# Patient Record
Sex: Male | Born: 1935 | Race: White | Hispanic: No | Marital: Single | Smoking: Former smoker
Health system: Southern US, Community
[De-identification: ages and names within clinical notes are randomized; demographics above are authoritative.]

## PROBLEM LIST (undated history)

## (undated) DIAGNOSIS — F0390 Unspecified dementia without behavioral disturbance: Secondary | ICD-10-CM

## (undated) DIAGNOSIS — G20 Parkinson's disease: Secondary | ICD-10-CM

## (undated) HISTORY — PX: HERNIA REPAIR: SHX51

---

## 2015-02-21 ENCOUNTER — Encounter (HOSPITAL_COMMUNITY): Payer: Self-pay | Admitting: *Deleted

## 2015-02-21 ENCOUNTER — Emergency Department (HOSPITAL_COMMUNITY)
Admission: EM | Admit: 2015-02-21 | Discharge: 2015-02-21 | Disposition: A | Discharge status: Home / Self Care | Payer: Medicare Other | Attending: Emergency Medicine | Admitting: Emergency Medicine

## 2015-02-21 DIAGNOSIS — F0390 Unspecified dementia without behavioral disturbance: Secondary | ICD-10-CM | POA: Insufficient documentation

## 2015-02-21 DIAGNOSIS — Z590 Homelessness: Secondary | ICD-10-CM | POA: Diagnosis present

## 2015-02-21 HISTORY — DX: Unspecified dementia, unspecified severity, without behavioral disturbance, psychotic disturbance, mood disturbance, and anxiety: F03.90
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
## 2015-02-21 NOTE — Discharge Instructions (Signed)

## 2015-02-21 NOTE — ED Provider Notes (Signed)
CSN: [id number]     Arrival date & time 02/21/15  1520 History   First MD Initiated Contact with Patient 02/21/15 1527     Chief Complaint  Patient presents with  . Homeless     Level V caveat: Dementia  HPI Patient had been living with his elderly wife who also has dementia.  The patient was taken to his sister's house who he had not seen in 20 years 2 weeks ago.  His sister reports that she had been caring form however it is becoming more difficult given her own medical issues to deal with them and to handle him and therefore he was sent to the emergency department today for possible placement into a nursing facility.  I spoke with the patient's other sister who lives in [place] who states she's been in [facility] and that everything was worked out for [facility] to [facility] and he would be admitted.  Instead the EMS team brought him to this emergency department.   Past Medical History  Diagnosis Date  . Dementia    History reviewed. No pertinent past surgical history. No family history on file. History  Substance Use Topics  . Smoking status: Not on file  . Smokeless tobacco: Not on file  . Alcohol Use: Not on file    Review of Systems  Unable to perform ROS     Allergies  Review of patient's allergies indicates no known allergies.  Home Medications   Prior to Admission medications   Not on File   There were no vitals taken for this visit. Physical Exam  Constitutional: He appears well-developed and well-nourished.  HENT:  Head: Normocephalic and atraumatic.  Eyes: EOM are normal.  Neck: Normal range of motion.  Cardiovascular: Normal rate, regular rhythm, normal heart sounds and intact distal pulses.   Pulmonary/Chest: Effort normal and breath sounds normal. No respiratory distress.  Abdominal: Soft. He exhibits no distension. There is no tenderness.  Musculoskeletal: Normal range of motion.  Neurological: He is alert.   Skin: Skin is warm and dry.  Psychiatric: He has a normal mood and affect. Judgment normal.  Nursing note and vitals reviewed.   ED Course  Procedures (including critical care time) Labs Review Labs Reviewed - No data to display  Imaging Review No results found.   EKG Interpretation None      MDM   Final diagnoses:  Dementia, without behavioral disturbance    Patient was seen and evaluated by myself.  No medical issue at this time.  This is simply placement.  Patient was seen in consultation by case management and social work.  We have gone through multiple possible avenues to try and gain and placement however this is not successful.  The patient will go home with home health resources at this time including a home health social worker who can help the family placed this patient as an outpatient.    Azalia BilisKevin Kamyah Wilhelmsen, MD 02/22/15 [phone]

## 2015-02-21 NOTE — ED Notes (Signed)
Patient went to live with his elderly sister and she is not able to take care of him or herself hardly. So the sister and ex-wife did not know what to do with him so EMS was called to assist with placement. Patient has dementia and has been wandering out of the home at night.
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
## 2015-02-22 NOTE — Care Management Note (Signed)
Case Management Note  Patient Details  Name: Tristan Jones MRN: [id number] Date of Birth: 02/25/1932  Subjective/Objective:    Discussed home health services with patient and patient's family             Action/Plan:  Patient discharged home with home health services   Expected Discharge Date:   02/21/2015               Expected Discharge Plan:  Home w Home Health Services  In-House Referral:  Clinical Social Work  Discharge planning Services     Post Acute Care Choice:  Home Health Choice offered to:  Sibling  DME Arranged:    DME Agency:     HH Arranged:  RN (Child psychotherapistsocial worker) HH Agency:  [facility]  Status of Service:  Completed, signed off  Medicare Important Message Given:    Date Medicare IM Given:    Medicare IM give by:    Date Additional Medicare IM Given:    Additional Medicare Important Message give by:     If discussed at Long Length of Stay Meetings, dates discussed:    Additional CommentsRadford Pax:  Laterrance Nauta, RN 02/22/2015, 5:22 PM

## 2015-08-03 ENCOUNTER — Emergency Department (HOSPITAL_COMMUNITY)
Admission: EM | Admit: 2015-08-03 | Discharge: 2015-08-03 | Disposition: A | Discharge status: Home / Self Care | Payer: Medicare Other | Attending: Emergency Medicine | Admitting: Emergency Medicine

## 2015-08-03 ENCOUNTER — Encounter (HOSPITAL_COMMUNITY): Payer: Self-pay | Admitting: Emergency Medicine

## 2015-08-03 ENCOUNTER — Emergency Department (HOSPITAL_COMMUNITY): Payer: Medicare Other

## 2015-08-03 DIAGNOSIS — R4182 Altered mental status, unspecified: Secondary | ICD-10-CM | POA: Diagnosis present

## 2015-08-03 DIAGNOSIS — G3183 Dementia with Lewy bodies: Secondary | ICD-10-CM | POA: Insufficient documentation

## 2015-08-03 DIAGNOSIS — F0391 Unspecified dementia with behavioral disturbance: Secondary | ICD-10-CM

## 2015-08-03 DIAGNOSIS — Z79899 Other long term (current) drug therapy: Secondary | ICD-10-CM | POA: Insufficient documentation

## 2015-08-03 HISTORY — DX: Parkinson's disease: G20

## 2015-08-03 LAB — URINALYSIS, ROUTINE W REFLEX MICROSCOPIC
Bilirubin Urine: NEGATIVE
Glucose, UA: NEGATIVE mg/dL
Ketones, ur: NEGATIVE mg/dL
Leukocytes, UA: NEGATIVE
Nitrite: NEGATIVE
PROTEIN: NEGATIVE mg/dL
Specific Gravity, Urine: 1.015 (ref 1.005–1.030)
pH: 6.5 (ref 5.0–8.0)

## 2015-08-03 LAB — COMPREHENSIVE METABOLIC PANEL
ALK PHOS: 101 U/L (ref 38–126)
ALT: 16 U/L — ABNORMAL LOW (ref 17–63)
AST: 21 U/L (ref 15–41)
Albumin: 3.7 g/dL (ref 3.5–5.0)
Anion gap: 9 (ref 5–15)
BILIRUBIN TOTAL: 0.4 mg/dL (ref 0.3–1.2)
BUN: 16 mg/dL (ref 6–20)
CALCIUM: 9.5 mg/dL (ref 8.9–10.3)
CO2: 28 mmol/L (ref 22–32)
CREATININE: 1.3 mg/dL — AB (ref 0.61–1.24)
Chloride: 104 mmol/L (ref 101–111)
GFR calc non Af Amer: 50 mL/min — ABNORMAL LOW (ref >60)
GFR, EST AFRICAN AMERICAN: 58 mL/min — AB (ref >60)
Glucose, Bld: 98 mg/dL (ref 65–99)
Potassium: 4.1 mmol/L (ref 3.5–5.1)
Sodium: 141 mmol/L (ref 135–145)
Total Protein: 6.8 g/dL (ref 6.5–8.1)

## 2015-08-03 LAB — URINE MICROSCOPIC-ADD ON: WBC, UA: NONE SEEN WBC/hpf (ref 0–5)

## 2015-08-03 LAB — CBC WITH DIFFERENTIAL/PLATELET
Basophils Absolute: 0.1 10*3/uL (ref 0.0–0.1)
Basophils Relative: 1 %
Eosinophils Absolute: 0.1 10*3/uL (ref 0.0–0.7)
Eosinophils Relative: 1 %
HEMATOCRIT: 38.7 % — AB (ref 39.0–52.0)
HEMOGLOBIN: 12.9 g/dL — AB (ref 13.0–17.0)
LYMPHS ABS: 1 10*3/uL (ref 0.7–4.0)
Lymphocytes Relative: 13 %
MCH: 32.4 pg (ref 26.0–34.0)
MCHC: 33.3 g/dL (ref 30.0–36.0)
MCV: 97.2 fL (ref 78.0–100.0)
MONOS PCT: 8 %
Monocytes Absolute: 0.6 10*3/uL (ref 0.1–1.0)
NEUTROS ABS: 5.6 10*3/uL (ref 1.7–7.7)
Neutrophils Relative %: 77 %
Platelets: 223 10*3/uL (ref 150–400)
RBC: 3.98 MIL/uL — AB (ref 4.22–5.81)
RDW: 12.5 % (ref 11.5–15.5)
WBC: 7.3 10*3/uL (ref 4.0–10.5)

## 2015-08-03 LAB — LIPASE, BLOOD: Lipase: 25 U/L (ref 11–51)

## 2015-08-03 LAB — CBG MONITORING, ED: Glucose-Capillary: 87 mg/dL (ref 65–99)

## 2015-08-03 NOTE — Progress Notes (Signed)
CSW engaged with Patient at bedside to discuss discharge plan. Patient agreeable to go home with ex-wife. Patient denies any questions or concerns at this time. CSW will continue to follow for discharge.   Noe GensAshley Gardner, LCSWA [phone]

## 2015-08-03 NOTE — Progress Notes (Signed)
CSW spoke with ex-wife via T/C. She reports that she is unable to pick patient up. She reports that her son is working on alternative transportation for the patient. CSW spoke with patient's son via T/C. He reports that he is in communication with the [facility].   CSW was contacted by Army F[employer]iffany Hall, SW at [facility] [phone])who reports that she has been in contact with Patient's son and Fuller CanadaQuiana Mock, SW. She was informed that Patient was medically cleared by MD for discharge from the ED and patient does not meet 3-day qualifying stay for SNF placement and per son is unable to private pay for long term placement. Army Fossaiffany Hall reports fear that Patient will return to ED due to family's inability to manage care.   CSW and Case Manager spoke with Dr. Jacky KindleAronson, Medical Advisor [facility] who instructed: Do not admit, Patient can not be placed from the ED, this is a family responsibility.

## 2015-08-03 NOTE — Progress Notes (Signed)
CSW engaged with Patient's ex-wife Ms. Dayton ScrapeBetty Hull [phone]), she reports that she has been contact with the VA and will be here to pick patient up at around noon today. CSW will continue to follow for disposition.   Noe GensAshley Gardner, LCSWA [phone]

## 2015-08-03 NOTE — ED Notes (Signed)
Dayton ScrapeBetty Hull (ex-wife) [phone] Lib (sister) [phone] Hart RobinsonsWarren Hagberg (son and POA) [phone]

## 2015-08-03 NOTE — ED Notes (Signed)
Patient was given a snack and a cup of water. A regular diet order was ordered for patient.

## 2015-08-03 NOTE — ED Notes (Signed)
Breakfast meal given.  Pt awaiting Social Work consult.

## 2015-08-03 NOTE — Progress Notes (Signed)
CSW engaged with Fuller CanadaQuiana Mock [phone]) [facility] [place] [place]. She reports that she has been very involved with the patient and his family and has offered a lot of options for care giving relief including adult daycare. She reports that the Patient does not currently meet qualifications for home care. She reports that she will be in contact with the family on today to discuss further care giving options [facility].   Noe GensAshley Gardner, LCSWA [phone]

## 2015-08-03 NOTE — ED Notes (Signed)
Pt finished most of his breakfast, got out of bed and put shoes on.  Pt ambulating in room, gait unsteady, diaper changed.  Pt was informed that his ex-wife will be here at noon for discharge.

## 2015-08-03 NOTE — ED Provider Notes (Addendum)
CSN: [id number]     Arrival date & time 08/03/15  0356 History   First MD Initiated Contact with Patient 08/03/15 0431     Chief Complaint  Patient presents with  . Altered Mental Status     (Consider location/radiation/quality/duration/timing/severity/associated sxs/prior Treatment) HPI  81 year old male who presents with altered mental status. History of dementia and Parkinson's disease. He alert and oriented to self and time at baseline. He is brought in by his son for 3 days of worsening dementia. Reportedly patient is not sleeping at night and having hallucinations. Patient states that he is here to have his prostate checked as it is difficult for him sometimes to urinate. Denies any dysuria, and states that he has occasional discomfort over his bladder. Denies any fever, nausea or vomiting, or diarrhea. States that he may have a mild cough as well, but denies any chest pain or difficulty breathing.  Past Medical History  Diagnosis Date  . Dementia   . Parkinson's disease [facility])    Past Surgical History  Procedure Laterality Date  . Hernia repair     History reviewed. No pertinent family history. Social History  Substance Use Topics  . Smoking status: Former Games developer  . Smokeless tobacco: None  . Alcohol Use: No    Review of Systems  Unable to perform ROS: Dementia      Allergies  Review of patient's allergies indicates no known allergies.  Home Medications   Prior to Admission medications   Medication Sig Start Date End Date Taking? Authorizing Provider  cholecalciferol (VITAMIN D) 1000 UNITS tablet Take 2,000 Units by mouth daily.   Yes Historical Provider, MD  cyanocobalamin 500 MCG tablet Take 1,000 mcg by mouth daily.   Yes Historical Provider, MD  donepezil (ARICEPT) 10 MG tablet Take 10 mg by mouth at bedtime.   Yes Historical Provider, MD  omeprazole (PRILOSEC) 20 MG capsule Take 20 mg by mouth daily.   Yes Historical Provider, MD  tamsulosin (FLOMAX) 0.4 MG  CAPS capsule Take 0.4 mg by mouth daily.   Yes Historical Provider, MD   BP 120/72 mmHg  Pulse 70  Temp(Src) 98.4 F (36.9 C) (Oral)  Resp 16  SpO2 94% Physical Exam Physical Exam  Nursing note and vitals reviewed. Constitutional: Thin appearing elderly man non-toxic, and in no acute distress Head: Normocephalic and atraumatic. Marland Kitchen  Mouth/Throat: Oropharynx is clear and moist.  Neck: Normal range of motion. Neck supple.  Cardiovascular: Normal rate and regular rhythm.   Pulmonary/Chest: Effort normal and breath sounds normal.  Abdominal: Soft. There is minimal suprapubic tenderness. No distension There is no rebound and no guarding.  Musculoskeletal: Normal range of motion.  Neurological: Alert, no facial droop, fluent speech, moves all extremities symmetrically Skin: Skin is warm and dry.  Psychiatric: Cooperative  ED Course  Procedures (including critical care time) Labs Review Labs Reviewed  CBC WITH DIFFERENTIAL/PLATELET - Abnormal; Notable for the following:    RBC 3.98 (*)    Hemoglobin 12.9 (*)    HCT 38.7 (*)    All other components within normal limits  COMPREHENSIVE METABOLIC PANEL - Abnormal; Notable for the following:    Creatinine, Ser 1.30 (*)    ALT 16 (*)    GFR calc non Af Amer 50 (*)    GFR calc Af Amer 58 (*)    All other components within normal limits  URINALYSIS, ROUTINE W REFLEX MICROSCOPIC (NOT AT [facility]) - Abnormal; Notable for the following:    APPearance CLOUDY (*)  Hgb urine dipstick TRACE (*)    All other components within normal limits  URINE MICROSCOPIC-ADD ON - Abnormal; Notable for the following:    Squamous Epithelial / LPF 0-5 (*)    Bacteria, UA RARE (*)    All other components within normal limits  LIPASE, BLOOD  CBG MONITORING, ED    Imaging Review Dg Chest 2 View  08/03/2015  CLINICAL DATA:  Initial evaluation for acute confusion. EXAM: CHEST  2 VIEW COMPARISON:  None. FINDINGS: Study is markedly limited by patient positioning.  Patient is markedly rotated to the right. Mild to moderate cardiomegaly is suspected. Mediastinal silhouette grossly normal. Possible small hiatal hernia noted. Lungs are normally inflated. Chronic coarsening of the interstitial markings. Node definite consolidative airspace opacity. No pulmonary edema or pleural effusion. No definite pneumothorax, although evaluation limited as the patient's head overlies the right lung apex. Osteopenia present. Chronic compression deformity at the thoracolumbar junction. Question acute right-sided rib fracture. Exact number of rib is difficult to discern due to patient positioning. IMPRESSION: 1. Limited study due to patient positioning. No definite acute cardiopulmonary abnormality. 2. Question acute right-sided rib fracture (arrow). Exact number of rib is difficult to discern due to patient positioning (possibly fourth, fifth or sixth posterolateral rib). 3. Chronic coarsening of the interstitial markings. 4. Probable cardiomegaly. Electronically Signed   By: Rise MuBenjamin  McClintock M.D.   On: 08/03/2015 06:12   Ct Head Wo Contrast  08/03/2015  CLINICAL DATA:  Confusion. Dementia and Parkinson's. EXAM: CT HEAD WITHOUT CONTRAST TECHNIQUE: Contiguous axial images were obtained from the base of the skull through the vertex without intravenous contrast. COMPARISON:  None. FINDINGS: Skull and Sinuses:Negative for fracture or destructive process. The visualized mastoids, middle ears, and imaged paranasal sinuses are clear. Visualized orbits: Negative. Brain: No evidence of acute infarction, hemorrhage, hydrocephalus, or mass lesion/mass effect. Generalized cerebral volume loss which is expected for age. Normal cerebral white matter. IMPRESSION: No acute finding.  Unremarkable head CT for age. Electronically Signed   By: Marnee SpringJonathon  Watts M.D.   On: 08/03/2015 06:04   I have personally reviewed and evaluated these images and lab results as part of my medical decision-making.   MDM    Final diagnoses:  Dementia, with behavioral disturbance    In short this is an 81 year old male with history of mild dementia and Parkinson's disease who presents with concern for altered mental status, which by history sounds more like sundowning in the setting of his dementia. On his arrival, he is alert, answering questions appropriately, is oriented 2, and conversational. Exam is unremarkable overall. His vital signs are non-concerning. No evidence of infection or metabolic/electrolyte derangements. CT head shows no acute intracranial processes. I do not feel that there is underlying medical problem that is causing his symptoms, and again more likely sundowning from dementia. He has been appropriate here, and is felt appropriate for discharge home. Strict return and follow-up instructions are reviewed. He expressed understanding of all discharge instructions and felt comfortable to plan of care.   8:15AM Notified that attempts were made to call patient's family to pick patient up from ED. Family refusing to come to pick up patient, stating that they are unable to care for patient now. Social work consult placed with plans to place to nursing facility.  Lavera Guiseana Duo Wiley Flicker, MD 08/03/15 [id number]  Lavera Guiseana Duo Thurmon Mizell, MD 08/03/15 [phone]

## 2015-08-03 NOTE — Discharge Instructions (Signed)

## 2015-08-03 NOTE — Progress Notes (Signed)
CSW contacted Patient's son and POA [phone]) who reports that due to his own medical conditions, is unable to care for his father at this time. He reports concern for his father's symptoms of dementia and feels that father needs to be admitted. CSW provided patient's son with resources for assisted living facilities in the area as well as informed him that due to patient not having a qualifying 3 night stay, they would be having to look for private pay placement. Patient's son reports that private pay is not an option at this time. Patient's son provided CSW with [facility] [phone]) so that CSW can make collateral contact and discuss discharge plan. Patient's son reports that Patient's ex-wife will pick patient up. CSW will continue to follow for disposition.   Noe GensAshley Gardner, LCSWA [phone]

## 2015-08-03 NOTE — ED Notes (Signed)
Pt arrived by [facility] from home with son. Son called EMS due to reported increasing dementia for 3 days (pt not sleeping at night and having hallucinations). Pt has hx of dementia and parkinson's. Pt alert and oriented to person and time, disoriented to place (per EMS). NKDA, last vitals 130/80, HR 80, 96% on RA, CBG 112.

## 2015-10-10 DEATH — deceased

## 2016-11-28 IMAGING — CT CT HEAD W/O CM
1 series · 16 of 30 positions shown, 20 images · non-contrast
Comparison: None.

CLINICAL DATA: Confusion. Dementia and Parkinson's.

EXAM:
CT HEAD WITHOUT CONTRAST
TECHNIQUE: Contiguous axial images were obtained from the base of the skull
through the vertex without intravenous contrast.

[Series 4: head 5.0 h30s · axial · 0.37mm/px · z∈[-230,-60]mm · 16 of 38 slices shown, 20 images]
[im 2/38  brain]
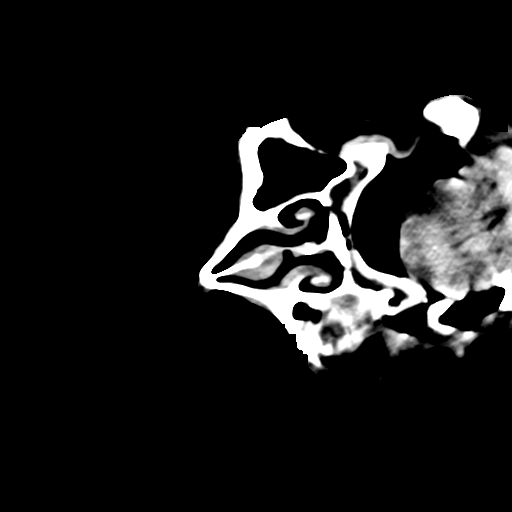
[im 2/38  bone]
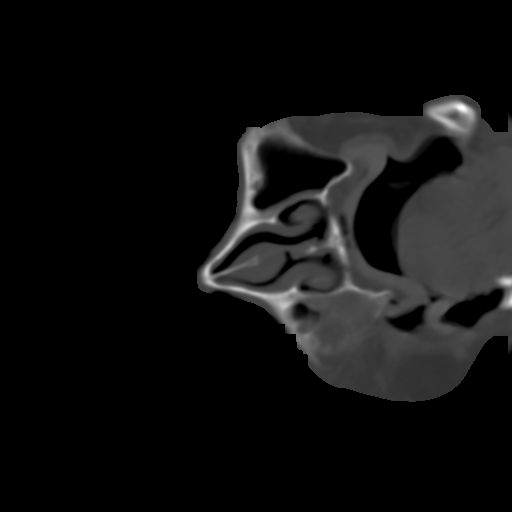
[im 4/38  brain]
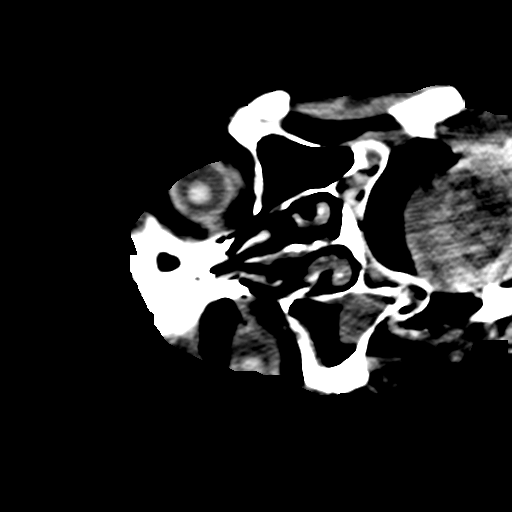
[im 7/38  brain]
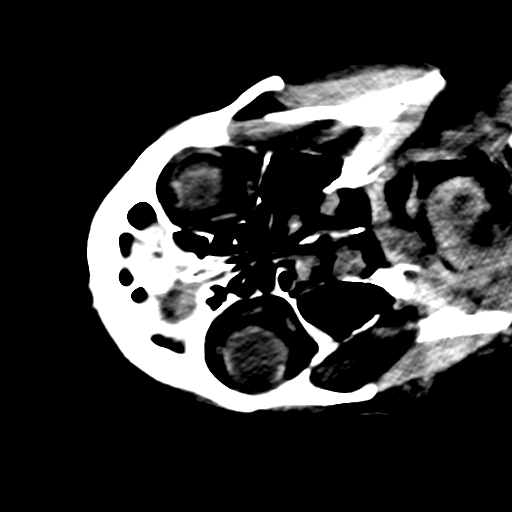
[im 9/38  brain]
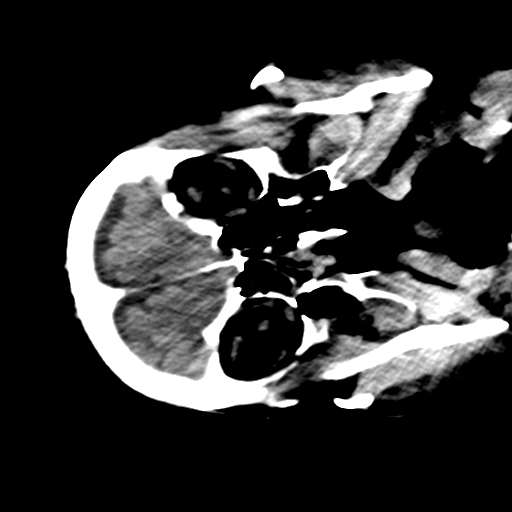
[im 11/38  brain]
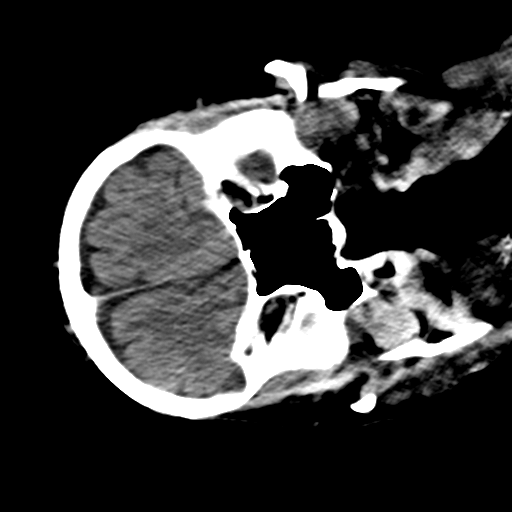
[im 11/38  bone]
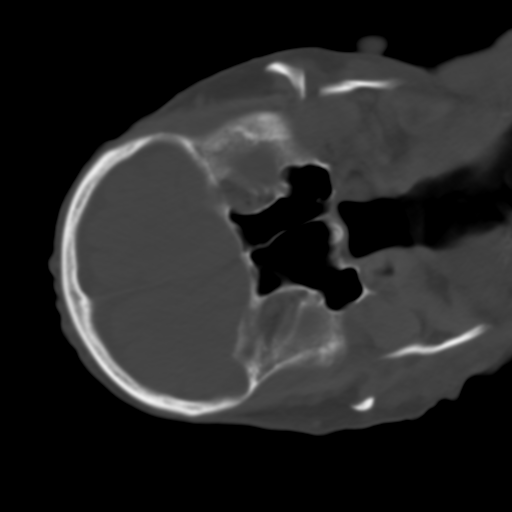
[im 13/38  brain]
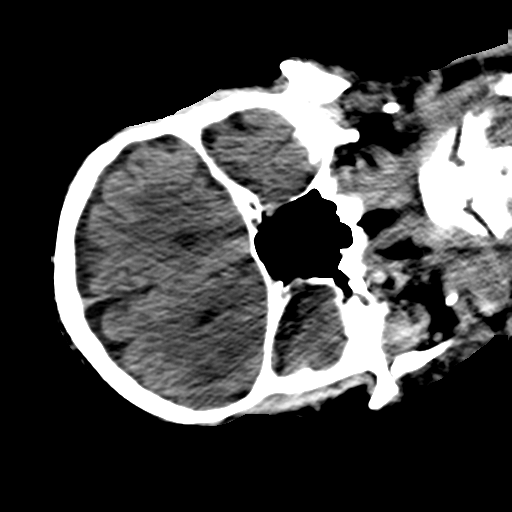
[im 16/38  brain]
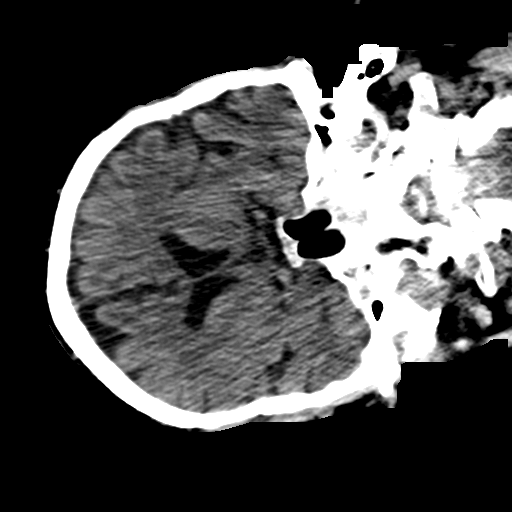
[im 18/38  brain]
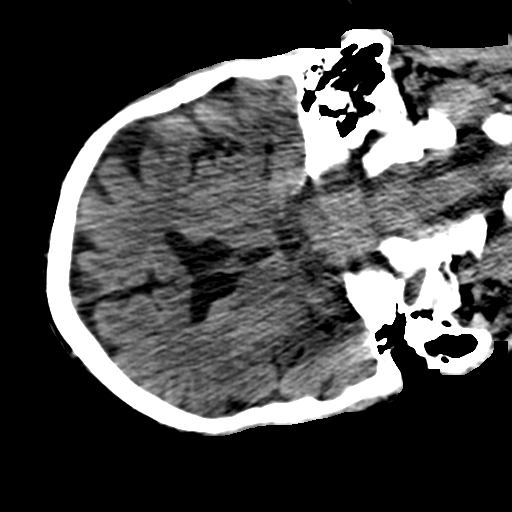
[im 20/38  brain]
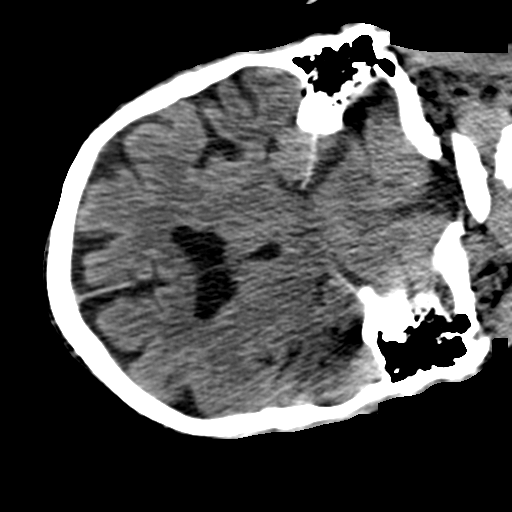
[im 20/38  bone]
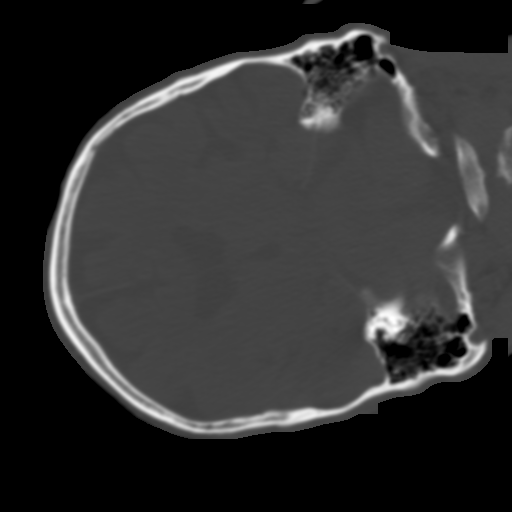
[im 22/38  brain]
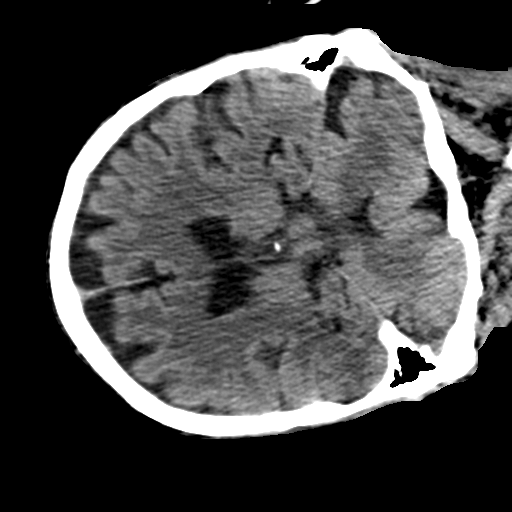
[im 25/38  brain]
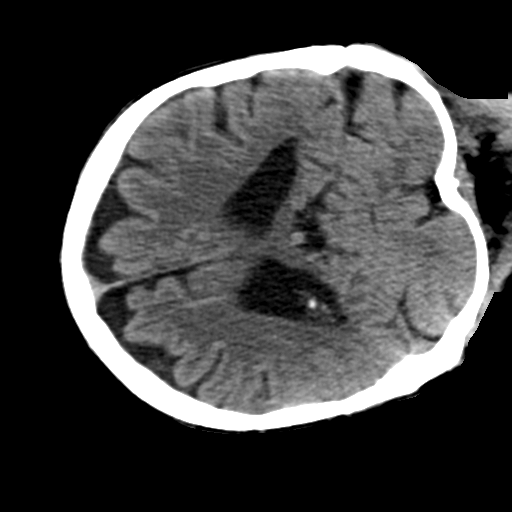
[im 27/38  brain]
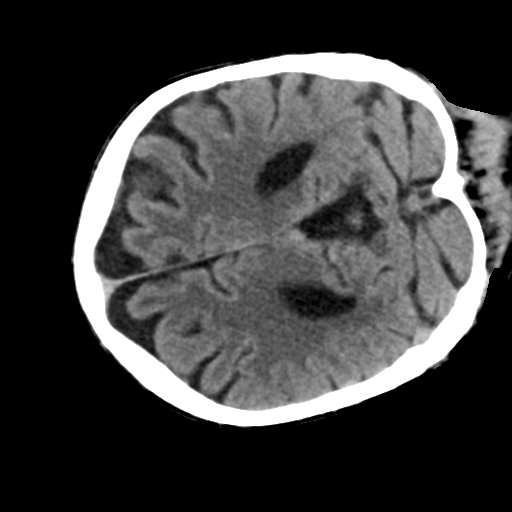
[im 29/38  brain]
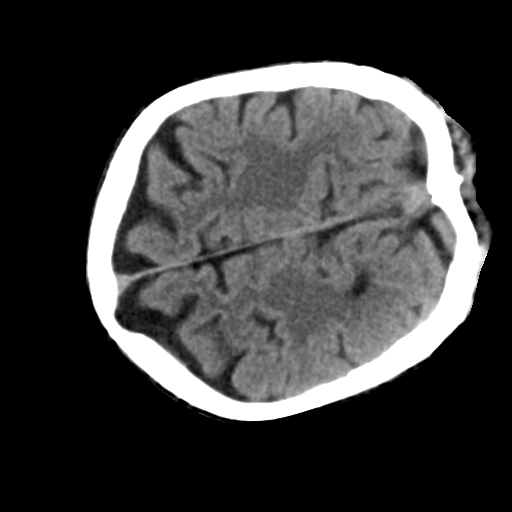
[im 29/38  bone]
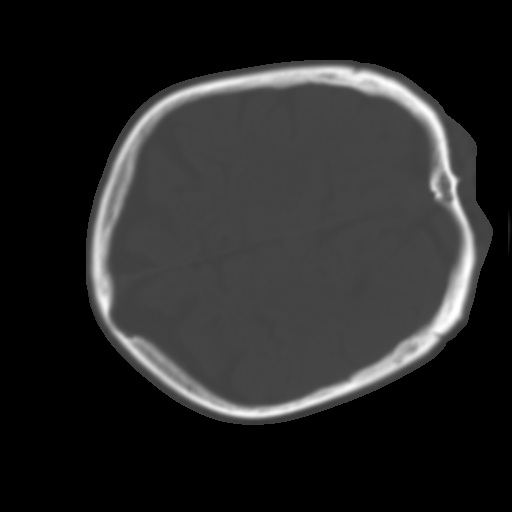
[im 31/38  brain]
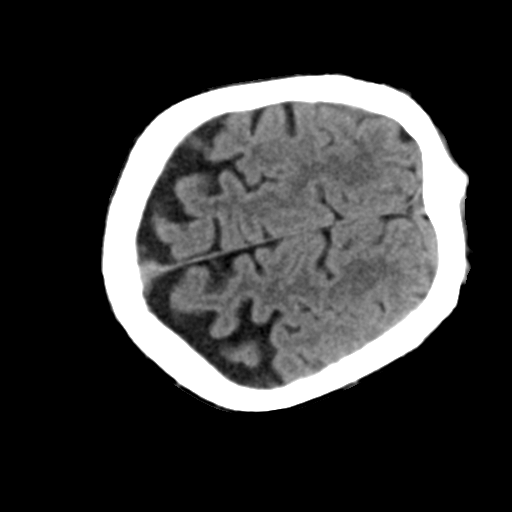
[im 34/38  brain]
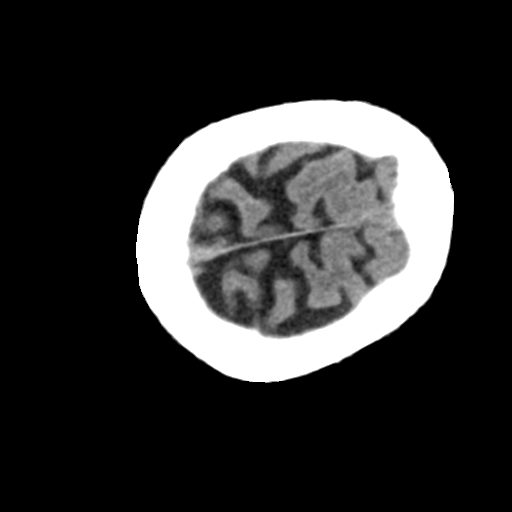
[im 36/38  brain]
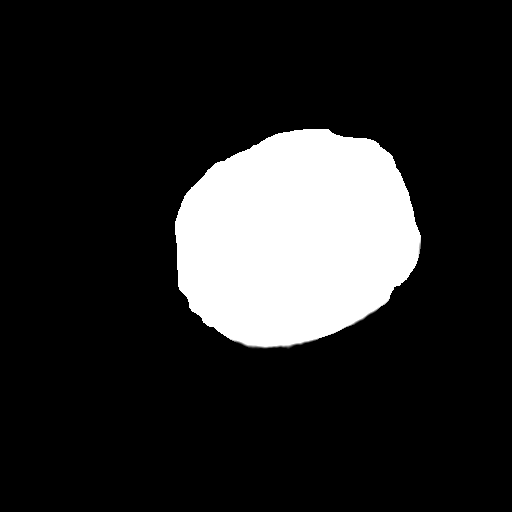

[16 of 30 positions shown; findings below may reference images not displayed]

FINDINGS: Skull and Sinuses:Negative for fracture or destructive process. The
visualized mastoids, middle ears, and imaged paranasal sinuses are
clear.

Visualized orbits: Negative.

Brain: No evidence of acute infarction, hemorrhage, hydrocephalus,
or mass lesion/mass effect. Generalized cerebral volume loss which
is expected for age. Normal cerebral white matter.
IMPRESSION: No acute finding.  Unremarkable head CT for age.
